# Patient Record
Sex: Male | Born: 1975 | Hispanic: Yes | Marital: Single | Smoking: Current some day smoker
Health system: Southern US, Community
[De-identification: ages and names within clinical notes are randomized; demographics above are authoritative.]

## PROBLEM LIST (undated history)

## (undated) HISTORY — PX: HERNIA REPAIR: SHX51

---

## 2014-10-02 ENCOUNTER — Emergency Department: Payer: Self-pay

## 2014-10-02 ENCOUNTER — Encounter: Payer: Self-pay | Admitting: Emergency Medicine

## 2014-10-02 ENCOUNTER — Emergency Department
Admission: EM | Admit: 2014-10-02 | Discharge: 2014-10-02 | Disposition: A | Discharge status: Home / Self Care | Payer: Self-pay | Attending: Emergency Medicine | Admitting: Emergency Medicine

## 2014-10-02 DIAGNOSIS — Z79899 Other long term (current) drug therapy: Secondary | ICD-10-CM | POA: Insufficient documentation

## 2014-10-02 DIAGNOSIS — N200 Calculus of kidney: Secondary | ICD-10-CM | POA: Insufficient documentation

## 2014-10-02 DIAGNOSIS — R1031 Right lower quadrant pain: Secondary | ICD-10-CM

## 2014-10-02 DIAGNOSIS — Z720 Tobacco use: Secondary | ICD-10-CM | POA: Insufficient documentation

## 2014-10-02 LAB — COMPREHENSIVE METABOLIC PANEL
ALK PHOS: 83 U/L (ref 38–126)
ALT: 19 U/L (ref 17–63)
AST: 26 U/L (ref 15–41)
Albumin: 4.6 g/dL (ref 3.5–5.0)
Anion gap: 12 (ref 5–15)
BILIRUBIN TOTAL: 0.4 mg/dL (ref 0.3–1.2)
BUN: 13 mg/dL (ref 6–20)
CALCIUM: 9.2 mg/dL (ref 8.9–10.3)
CHLORIDE: 104 mmol/L (ref 101–111)
CO2: 22 mmol/L (ref 22–32)
Creatinine, Ser: 0.91 mg/dL (ref 0.61–1.24)
GFR calc Af Amer: >60 mL/min (ref >60)
GFR calc non Af Amer: >60 mL/min (ref >60)
Glucose, Bld: 131 mg/dL — ABNORMAL HIGH (ref 65–99)
POTASSIUM: 3.1 mmol/L — AB (ref 3.5–5.1)
SODIUM: 138 mmol/L (ref 135–145)
Total Protein: 8.2 g/dL — ABNORMAL HIGH (ref 6.5–8.1)

## 2014-10-02 LAB — URINALYSIS COMPLETE WITH MICROSCOPIC (ARMC ONLY)
BILIRUBIN URINE: NEGATIVE
Bacteria, UA: NONE SEEN
GLUCOSE, UA: NEGATIVE mg/dL
Ketones, ur: NEGATIVE mg/dL
Leukocytes, UA: NEGATIVE
Nitrite: NEGATIVE
Protein, ur: NEGATIVE mg/dL
SQUAMOUS EPITHELIAL / LPF: NONE SEEN
Specific Gravity, Urine: 1.018 (ref 1.005–1.030)
pH: 5 (ref 5.0–8.0)

## 2014-10-02 LAB — CBC WITH DIFFERENTIAL/PLATELET
BASOS ABS: 0.1 10*3/uL (ref 0–0.1)
Basophils Relative: 1 %
Eosinophils Absolute: 0.4 10*3/uL (ref 0–0.7)
Eosinophils Relative: 4 %
HEMATOCRIT: 47.3 % (ref 40.0–52.0)
HEMOGLOBIN: 16.5 g/dL (ref 13.0–18.0)
LYMPHS PCT: 27 %
Lymphs Abs: 2.6 10*3/uL (ref 1.0–3.6)
MCH: 31.2 pg (ref 26.0–34.0)
MCHC: 34.9 g/dL (ref 32.0–36.0)
MCV: 89.4 fL (ref 80.0–100.0)
MONO ABS: 0.5 10*3/uL (ref 0.2–1.0)
Monocytes Relative: 5 %
NEUTROS ABS: 6.2 10*3/uL (ref 1.4–6.5)
Neutrophils Relative %: 63 %
PLATELETS: 203 10*3/uL (ref 150–440)
RBC: 5.29 MIL/uL (ref 4.40–5.90)
RDW: 13.6 % (ref 11.5–14.5)
WBC: 9.7 10*3/uL (ref 3.8–10.6)

## 2014-10-02 MED ORDER — POTASSIUM CHLORIDE CRYS ER 20 MEQ PO TBCR
EXTENDED_RELEASE_TABLET | ORAL | Status: AC
  Administered 2014-10-02: 40 meq via ORAL
  Filled 2014-10-02: qty 2

## 2014-10-02 MED ORDER — TAMSULOSIN HCL 0.4 MG PO CAPS: 0.4000 mg | ORAL_CAPSULE | Freq: Every day | ORAL | Status: AC

## 2014-10-02 MED ORDER — ONDANSETRON HCL 4 MG PO TABS: 4.0000 mg | ORAL_TABLET | Freq: Three times a day (TID) | ORAL | Status: AC | PRN

## 2014-10-02 MED ORDER — KETOROLAC TROMETHAMINE 30 MG/ML IJ SOLN
INTRAMUSCULAR | Status: AC
  Administered 2014-10-02: 30 mg via INTRAVENOUS
  Filled 2014-10-02: qty 1

## 2014-10-02 MED ORDER — SODIUM CHLORIDE 0.9 % IV BOLUS (SEPSIS)
1000.0000 mL | Freq: Once | INTRAVENOUS | Status: AC
  Administered 2014-10-02: 1000 mL via INTRAVENOUS

## 2014-10-02 MED ORDER — ONDANSETRON HCL 4 MG/2ML IJ SOLN
INTRAMUSCULAR | Status: AC
  Administered 2014-10-02: 4 mg via INTRAVENOUS
  Filled 2014-10-02: qty 2

## 2014-10-02 MED ORDER — OXYCODONE-ACETAMINOPHEN 5-325 MG PO TABS: 1.0000 | ORAL_TABLET | ORAL | Status: AC | PRN

## 2014-10-02 MED ORDER — ONDANSETRON HCL 4 MG/2ML IJ SOLN
4.0000 mg | Freq: Once | INTRAMUSCULAR | Status: AC
  Administered 2014-10-02: 4 mg via INTRAVENOUS

## 2014-10-02 MED ORDER — KETOROLAC TROMETHAMINE 30 MG/ML IJ SOLN
30.0000 mg | Freq: Once | INTRAMUSCULAR | Status: AC
  Administered 2014-10-02: 30 mg via INTRAVENOUS

## 2014-10-02 MED ORDER — POTASSIUM CHLORIDE CRYS ER 20 MEQ PO TBCR
40.0000 meq | EXTENDED_RELEASE_TABLET | Freq: Once | ORAL | Status: AC
  Administered 2014-10-02: 40 meq via ORAL

## 2014-10-02 MED ORDER — HYDROMORPHONE HCL 1 MG/ML IJ SOLN
INTRAMUSCULAR | Status: AC
  Administered 2014-10-02: 1 mg via INTRAVENOUS
  Filled 2014-10-02: qty 1

## 2014-10-02 MED ORDER — HYDROMORPHONE HCL 1 MG/ML IJ SOLN
1.0000 mg | Freq: Once | INTRAMUSCULAR | Status: AC
  Administered 2014-10-02: 1 mg via INTRAVENOUS

## 2014-10-02 NOTE — ED Notes (Signed)
Patient ambulatory to triage with steady gait, without difficulty or distress noted; [facility], pt reports having right lower abd pain x 3 hours accomp by nausea; denies hx of same; was awoken with pain

## 2014-10-02 NOTE — ED Provider Notes (Signed)
-----------------------------------------   9:24 AM on 10/02/2014 -----------------------------------------  Clifton CustardAaron without signs of infection. Will proceed with discharge paperwork prescriptions prepared by Dr.  Dolores FrameSung.  Phineas SemenGraydon Alexzandrea Normington, MD 10/02/14 [phone]

## 2014-10-02 NOTE — ED Notes (Signed)
Patient transported to CT 

## 2014-10-02 NOTE — ED Provider Notes (Signed)
[facility] Emergency Department Provider Note  ____________________________________________  Time seen: Approximately 5:14 AM  I have reviewed the triage vital signs and the nursing notes.   HISTORY  Chief Complaint Abdominal Pain  History obtained via Spanish interpreter.   HPI Harry Thompson is a 39 y.o. male who presents with sudden onset right lower quadrant pain approximately 2-3 hours ago. Patient describes 10/10 sharp, crampy pain radiating to right testicle associated with nausea. Has never experienced pain like this before. Pain is constant at baseline and waxes and wanes. Nothing makes the pain better or worse. Patient denies fever, chills, chest pain, shortness of breath, vomiting, diarrhea, dysuria, numbness, weakness, tingling. Patient denies trauma/injury.   History reviewed. No pertinent past medical history. No history of kidney stones. There are no active problems to display for this patient.   Past Surgical History  Procedure Laterality Date  . Hernia repair      Current Outpatient Rx  Name  Route  Sig  Dispense  Refill  . ondansetron (ZOFRAN) 4 MG tablet   Oral   Take 1 tablet (4 mg total) by mouth every 8 (eight) hours as needed for nausea or vomiting.   30 tablet   1   . oxyCODONE-acetaminophen (ROXICET) 5-325 MG per tablet   Oral   Take 1 tablet by mouth every 4 (four) hours as needed for severe pain.   30 tablet   0   . tamsulosin (FLOMAX) 0.4 MG CAPS capsule   Oral   Take 1 capsule (0.4 mg total) by mouth daily.   14 capsule   0     Allergies Review of patient's allergies indicates no known allergies.  History reviewed. No pertinent family history. Cousin with kidney stones.  Social History History  Substance Use Topics  . Smoking status: Current Some Day Smoker  . Smokeless tobacco: Not on file  . Alcohol Use: Yes     Comment: occasional    Review of Systems Constitutional: No fever/chills Eyes: No  visual changes. ENT: No sore throat. Cardiovascular: Denies chest pain. Respiratory: Denies shortness of breath. Gastrointestinal: Positive for abdominal pain.  Positive for nausea. No vomiting.  No diarrhea.  No constipation. Genitourinary: Negative for dysuria. Musculoskeletal: Negative for back pain. Skin: Negative for rash. Neurological: Negative for headaches, focal weakness or numbness.  10-point ROS otherwise negative.  ____________________________________________   PHYSICAL EXAM:  VITAL SIGNS: ED Triage Vitals  Enc Vitals Group     BP 10/02/14 0447 134/68 mmHg     Pulse Rate 10/02/14 0447 61     Resp 10/02/14 0447 22     Temp 10/02/14 0447 97.4 F (36.3 C)     Temp Source 10/02/14 0447 Oral     SpO2 10/02/14 0447 100 %     Weight 10/02/14 0447 141 lb 1.5 oz (64 kg)     Height 10/02/14 0447 5' 6.93" (1.7 m)     Head Cir --      Peak Flow --      Pain Score 10/02/14 0448 9     Pain Loc --      Pain Edu? --      Excl. in GC? --     Constitutional: Alert and oriented. Well appearing and in moderate acute distress. Eyes: Conjunctivae are normal. PERRL. EOMI. Head: Atraumatic. Nose: No congestion/rhinnorhea. Mouth/Throat: Mucous membranes are moist.  Oropharynx non-erythematous. Neck: No stridor.  Cardiovascular: Normal rate, regular rhythm. Grossly normal heart sounds.  Good peripheral circulation. Respiratory:  Normal respiratory effort.  No retractions. Lungs CTAB. Gastrointestinal: Soft, tender to palpation right lower quadrant without rebound or guarding. No distention. No abdominal bruits. No CVA tenderness. Genitourinary: No penile discharge. No testicular swelling or tenderness to palpation. Cremaster reflexes strong and symmetrical bilaterally. Musculoskeletal: No lower extremity tenderness nor edema.  No joint effusions. Neurologic:  Normal speech and language. No gross focal neurologic deficits are appreciated. Speech is normal. No gait instability. Skin:   Skin is warm, dry and intact. No rash noted. Psychiatric: Mood and affect are normal. Speech and behavior are normal.  ____________________________________________   LABS (all labs ordered are listed, but only abnormal results are displayed)  Labs Reviewed  COMPREHENSIVE METABOLIC PANEL - Abnormal; Notable for the following:    Potassium 3.1 (*)    Glucose, Bld 131 (*)    Total Protein 8.2 (*)    All other components within normal limits  URINALYSIS COMPLETEWITH MICROSCOPIC ([facility])  - Abnormal; Notable for the following:    Color, Urine YELLOW (*)    APPearance CLEAR (*)    Hgb urine dipstick 3+ (*)    All other components within normal limits  CBC WITH DIFFERENTIAL/PLATELET   ____________________________________________  EKG  None ____________________________________________  RADIOLOGY  CT renal stone study interpreted per Dr. Manus GunningEhinger: Punctate obstructing stone at the right ureterovesicular junction with resultant mild hydroureteronephrosis and perinephric stranding.  ____________________________________________   PROCEDURES  Procedure(s) performed: None  Critical Care performed: No  ____________________________________________   INITIAL IMPRESSION / ASSESSMENT AND PLAN / ED COURSE  Pertinent labs & imaging results that were available during my care of the patient were reviewed by me and considered in my medical decision making (see chart for details).  39 year old male presenting with sudden onset right lower quadrant pain suspicious of kidney stone. Will initiate IV fluid resuscitation, IV analgesia and antiemetic, CT renal protocol to evaluate nephrolithiasis. ____________________________________________   FINAL CLINICAL IMPRESSION(S) / ED DIAGNOSES  Final diagnoses:  Right lower quadrant abdominal pain  Kidney stones      Irean HongJade J Lynley Killilea, MD 10/03/14 [phone]

## 2014-10-02 NOTE — Discharge Instructions (Signed)
1. Take medicines as needed for pain & nausea (percocet/zofran #30). 2. Take flomax daily x 14 days. 3. Drink plenty of bottled or filtered water daily. 4. Return to the ER for worsening symptoms, persistent vomiting, fever or other concerns.  Clculos renales (Kidney Stones) Los clculos renales (urolitiasis) son masas slidas que se forman en el interior de los riones. El dolor intenso es causado por el movimiento de la piedra a travs del tracto urinario. Cuando la piedra se mueve, el urter hace un espasmo alrededor de la misma. El clculo generalmente se elimina con la [place].  CAUSAS   Un trastorno que hace que ciertas glndulas del cuello produzcan demasiada hormona paratiroidea (hiperparatiroidismo primario).  Una acumulacin de cristales de cido rico, similar a Brewing technologistla gota en las articulaciones.  Estrechamiento (constriccin) del urter.  Obstruccin en el rin presente al nacer (obstruccin congnita).  Cirugas previas del rin o los urteres.  Numerosas infecciones renales. SNTOMAS   Ganas de vomitar (nuseas).  Devolver la comida (vomitar).  Sangre en la orina (hematuria).  Dolor que generalmente se expande (irradia) hacia la ingle.  Ganas de orinar con frecuencia o de manera urgente. DIAGNSTICO   Historia clnica y examen fsico.  Anlisis de sangre y [place].  Tomografa computada.  En algunos casos se realiza un examen del interior de la vejiga (citoscopa). TRATAMIENTO   Observacin.  Aumentar la ingesta de lquidos.  Litotricia extracorprea con ondas de choque: es un procedimiento no invasivo que [place] ondas de cho[place] para romper los clculos renales.  Ser necesaria la ciruga si tiene dolor muy intenso o la obstruccin persiste. Hay varios procedimientos quirrgicos. La mayora de los procedimientos se realizan con el uso de pequeos instrumentos. Slo es Passenger transport managernecesario realizar pequeas incisiones para acomodar estos instrumentos, por lo tanto el  tiempo de recuperacin es mnimo. El tamao, la ubicacin y la composicin qumica de los clculos son variables importantes que determinarn la eleccin correcta de tratamiento para su caso. Comunquese con su mdico para comprender mejor su situacin, de modo que pueda ArvinMeritorminimizar los riesgos de lesiones para usted y su rin.  INSTRUCCIONES PARA EL CUIDADO EN EL HOGAR   Beba gran cantidad de lquido para mantener la orina de tono claro o color amarillo plido. Esto ayudar a eliminar las piedras o los fragmentos.  Cuele la orina con el colador que le han provisto. Guarde todas las partculas y piedras para que las vea el profesional que lo asiste. Puede ser tan pequea como un grano de sal. Es muy importante usar el colador cada vez que orine. La recoleccin de piedras permitir al [employer] analizar y Investment banker, operationalverificar que efectivamente ha eliminado una piedra. El anlisis de la piedra con frecuencia permitir identificar qu puede hacer para reducir la incidencia de las recurrencias.  Slo tome medicamentos de venta libre o recetados para Primary school teachercalmar el dolor, Environmental health practitionerel malestar o bajar la fiebre, segn las indicaciones de su mdico.  Cumpla con las citas de seguimiento tal como le indic el profesional que lo asiste.  Si se lo indica, hgase radiografas. La ausencia de dolor no siempre significa que las piedras se han eliminado. Puede ser que simplemente hayan dejado de moverse. Si el paso de orina permanece completamente obstruido, puede causar prdida de la funcin renal o simplemente la destruccin del rin. Es su responsabilidad Chartered certified accountantcompletar el seguimiento y las radiografas. Las ecografas del rin pueden mostrar una obstruccin y el estado del rin. Las ecografas no se asocian con la radiacin y Nurse, learning disabilitypueden realizarse fcilmente en  cuestin de minutos. SOLICITE ATENCIN MDICA SI:  Siente dolor que no responde a los Public affairs consultantanalgsicos que le recetaron. SOLICITE ATENCIN MDICA DE INMEDIATO SI:   No puede controlar el  dolor con los medicamentos que le han recetado.  Siente escalofros o fiebre.  La gravedad o la intensidad del dolor aumenta durante 18 horas y no se Chief Executive Officeralivia con los analgsicos.  Presenta un nuevo episodio de dolor abdominal.  Sufre mareos o se desmaya.  No puede orinar. ASEGRESE DE QUE:   Comprende estas instrucciones.  Controlar su afeccin.  Recibir ayuda de inmediato si no mejora o si empeora. Document Released: 05/02/2005 Document Revised: 01/02/2013 [facility] Patient Information 2015 [employer] [place]. This information is not intended to replace advice given to you by your health care provider. Make sure you discuss any questions you have with your health care provider.

## 2014-10-02 NOTE — ED Notes (Signed)
Interpreter at bedside for d/c instructions  

## 2016-10-28 IMAGING — CT CT RENAL STONE PROTOCOL
1 of 4 series · 5 of 46 positions shown, 10 images · non-contrast
Comparison: None.

CLINICAL DATA: Sudden onset of right lower abdominal pain for 3
hours. Nausea.

EXAM:
CT ABDOMEN AND PELVIS WITHOUT CONTRAST
TECHNIQUE: Multidetector CT imaging of the abdomen and pelvis was performed
following the standard protocol without IV contrast.

[Series 4: lung windows · axial · 0.72mm/px · z∈[-194,-124]mm · 5 of 22 slices shown, 10 images]
[im 4/22  soft-tissue]
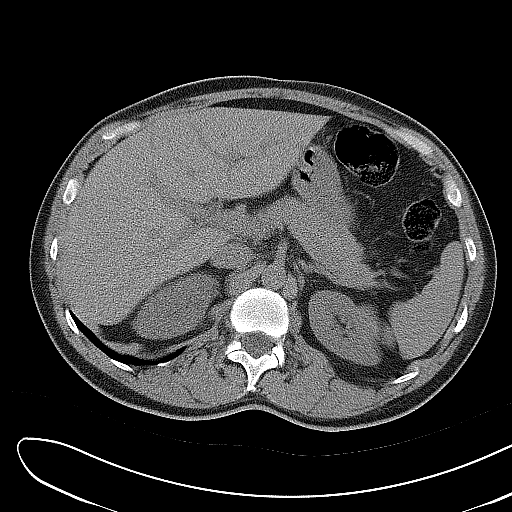
[im 4/22  bone]
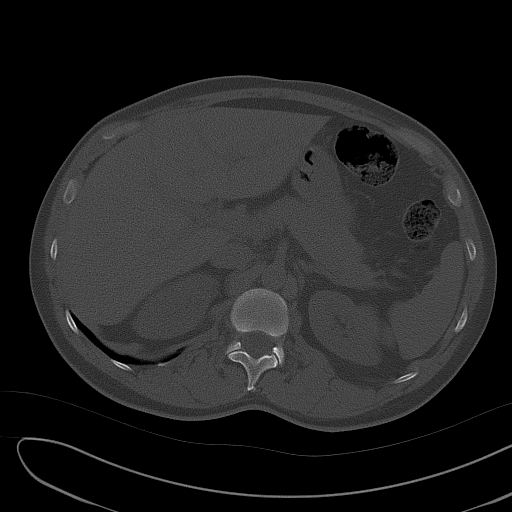
[im 8/22  soft-tissue]
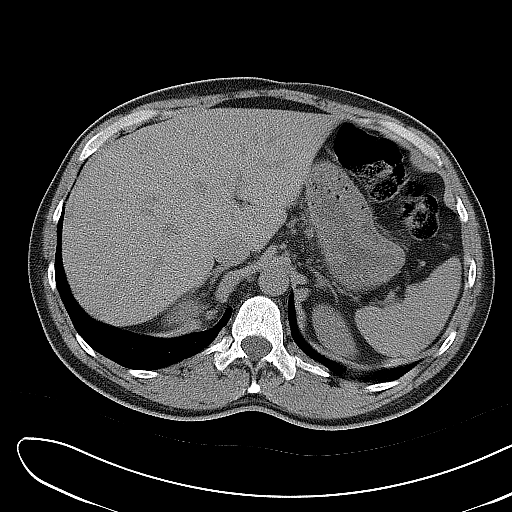
[im 8/22  lung]
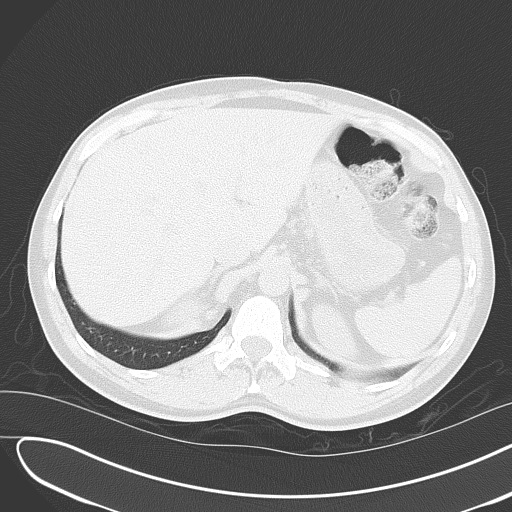
[im 11/22  soft-tissue]
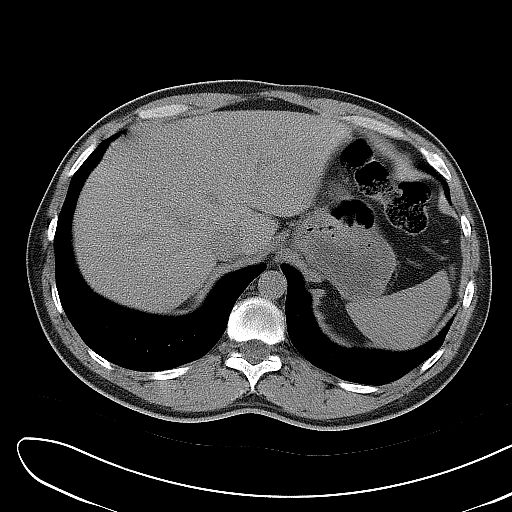
[im 11/22  lung]
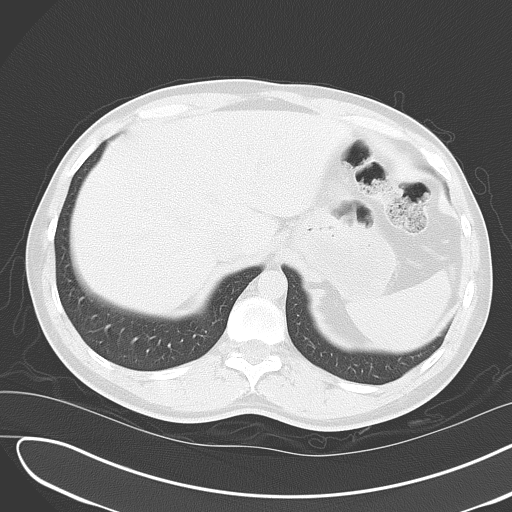
[im 15/22  soft-tissue]
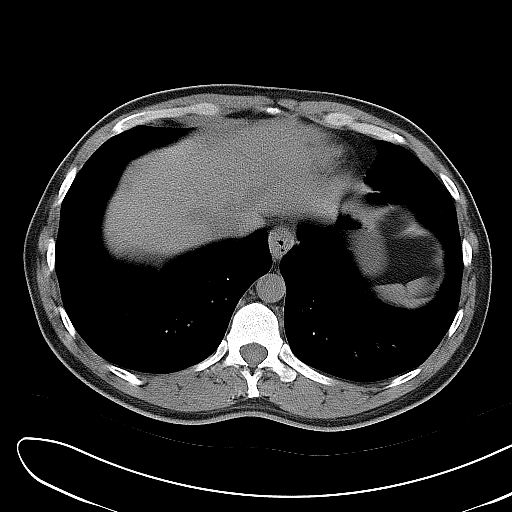
[im 15/22  lung]
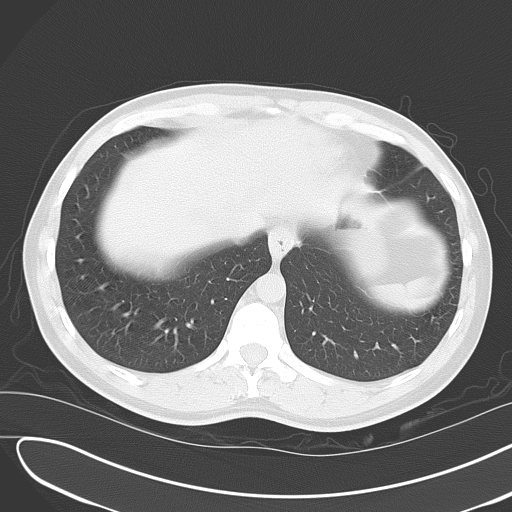
[im 18/22  soft-tissue]
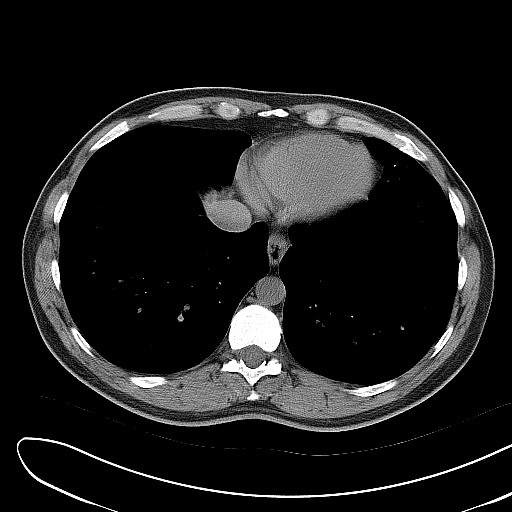
[im 18/22  lung]
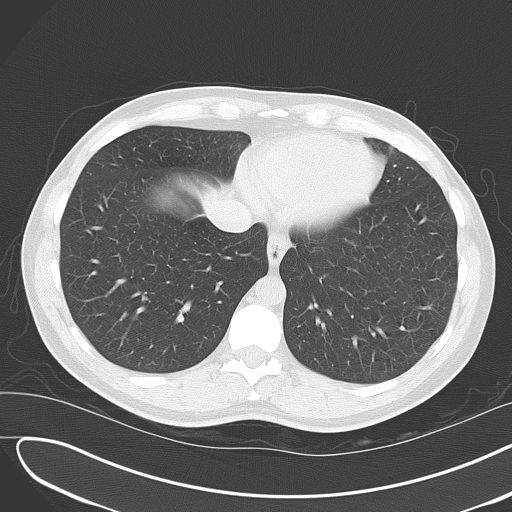

[5 of 46 positions shown; findings below may reference images not displayed]

FINDINGS: The included lung bases are clear.

There is a punctate obstructing stone at the right ureterovesicular
junction with resultant mild hydroureteronephrosis and perinephric
stranding. This is best appreciated on the coronal reformats. More
inferiorly there is a phlebolith in the right pelvis. There are no
additional nonobstructing renal stones. Left kidney is normal. Left
ureter is decompressed. Urinary bladder is near completely
decompressed.

The unenhanced liver, gallbladder, spleen, pancreas, and adrenal
glands are normal.

Stomach is physiologically distended. There are no dilated or
thickened bowel loops. There is mild feculization of small bowel
contents suggesting slow transit. Moderate stool throughout the
colon. No colonic wall thickening. The appendix is normal. No free
air, free fluid, or intra-abdominal fluid collection.

No retroperitoneal adenopathy. Abdominal aorta is normal in caliber.

Within the pelvis there is no free fluid. Prostate gland is normal
in size. No pelvic free fluid or adenopathy.

There are no acute or suspicious osseous abnormalities. Incidental
note of 4 non-rib-bearing lumbar vertebra.
IMPRESSION: Punctate obstructing stone at the right ureterovesicular junction
with resultant mild hydroureteronephrosis and perinephric stranding.
